# Patient Record
Sex: Female | Born: 1955 | Hispanic: No | Smoking: Never smoker
Health system: Southern US, Community
[De-identification: ages and names within clinical notes are randomized; demographics above are authoritative.]

## PROBLEM LIST (undated history)

## (undated) DIAGNOSIS — N289 Disorder of kidney and ureter, unspecified: Secondary | ICD-10-CM

## (undated) DIAGNOSIS — I10 Essential (primary) hypertension: Secondary | ICD-10-CM

## (undated) HISTORY — PX: LIVER BIOPSY: SHX301

## (undated) HISTORY — PX: CARDIAC CATHETERIZATION: SHX172

---

## 2000-02-12 ENCOUNTER — Other Ambulatory Visit: Admission: RE | Admit: 2000-02-12 | Discharge: 2000-02-12 | Payer: Self-pay | Admitting: Obstetrics and Gynecology

## 2006-03-17 ENCOUNTER — Ambulatory Visit: Payer: Self-pay | Admitting: Cardiology

## 2006-06-08 ENCOUNTER — Emergency Department: Payer: Self-pay | Admitting: Unknown Physician Specialty

## 2007-11-28 ENCOUNTER — Ambulatory Visit: Payer: Self-pay | Admitting: Internal Medicine

## 2008-01-07 ENCOUNTER — Emergency Department: Payer: Self-pay | Admitting: Emergency Medicine

## 2009-03-31 ENCOUNTER — Ambulatory Visit: Payer: Self-pay | Admitting: Internal Medicine

## 2009-11-29 ENCOUNTER — Ambulatory Visit: Payer: Self-pay | Admitting: Unknown Physician Specialty

## 2010-06-29 ENCOUNTER — Ambulatory Visit: Payer: Self-pay | Admitting: Family Medicine

## 2010-06-29 ENCOUNTER — Inpatient Hospital Stay: Payer: Self-pay | Admitting: Internal Medicine

## 2011-01-21 ENCOUNTER — Ambulatory Visit: Payer: Self-pay | Admitting: Unknown Physician Specialty

## 2011-01-22 LAB — PATHOLOGY REPORT

## 2011-06-05 ENCOUNTER — Emergency Department: Payer: Self-pay | Admitting: *Deleted

## 2011-06-16 ENCOUNTER — Encounter: Payer: Self-pay | Admitting: Physician Assistant

## 2011-06-29 ENCOUNTER — Encounter: Payer: Self-pay | Admitting: Physician Assistant

## 2011-07-10 ENCOUNTER — Ambulatory Visit: Payer: Self-pay | Admitting: Internal Medicine

## 2011-07-27 ENCOUNTER — Ambulatory Visit: Payer: Self-pay | Admitting: Physician Assistant

## 2011-07-30 ENCOUNTER — Encounter: Payer: Self-pay | Admitting: Physician Assistant

## 2011-08-29 ENCOUNTER — Encounter: Payer: Self-pay | Admitting: Physician Assistant

## 2011-09-29 ENCOUNTER — Encounter: Payer: Self-pay | Admitting: Physician Assistant

## 2012-02-28 ENCOUNTER — Ambulatory Visit: Payer: Self-pay | Admitting: Medical

## 2012-04-25 ENCOUNTER — Ambulatory Visit: Payer: Self-pay | Admitting: Family Medicine

## 2015-02-19 ENCOUNTER — Ambulatory Visit: Payer: Self-pay

## 2015-02-19 ENCOUNTER — Other Ambulatory Visit: Payer: Self-pay | Admitting: Family Medicine

## 2015-02-19 ENCOUNTER — Ambulatory Visit
Admission: RE | Admit: 2015-02-19 | Discharge: 2015-02-19 | Disposition: A | Discharge status: Home / Self Care | Payer: Managed Care, Other (non HMO) | Source: Ambulatory Visit | Attending: Family Medicine | Admitting: Family Medicine

## 2015-02-19 DIAGNOSIS — R102 Pelvic and perineal pain: Secondary | ICD-10-CM

## 2015-02-19 DIAGNOSIS — G8929 Other chronic pain: Secondary | ICD-10-CM

## 2015-02-19 DIAGNOSIS — D259 Leiomyoma of uterus, unspecified: Secondary | ICD-10-CM | POA: Diagnosis not present

## 2015-03-02 ENCOUNTER — Emergency Department: Payer: Managed Care, Other (non HMO)

## 2015-03-02 ENCOUNTER — Encounter: Payer: Self-pay | Admitting: Emergency Medicine

## 2015-03-02 ENCOUNTER — Emergency Department
Admission: EM | Admit: 2015-03-02 | Discharge: 2015-03-02 | Disposition: A | Discharge status: Home / Self Care | Payer: Managed Care, Other (non HMO) | Attending: Emergency Medicine | Admitting: Emergency Medicine

## 2015-03-02 ENCOUNTER — Other Ambulatory Visit: Payer: Self-pay

## 2015-03-02 DIAGNOSIS — R61 Generalized hyperhidrosis: Secondary | ICD-10-CM | POA: Insufficient documentation

## 2015-03-02 DIAGNOSIS — R0602 Shortness of breath: Secondary | ICD-10-CM | POA: Diagnosis not present

## 2015-03-02 DIAGNOSIS — Z79899 Other long term (current) drug therapy: Secondary | ICD-10-CM | POA: Insufficient documentation

## 2015-03-02 DIAGNOSIS — I10 Essential (primary) hypertension: Secondary | ICD-10-CM | POA: Diagnosis not present

## 2015-03-02 DIAGNOSIS — R079 Chest pain, unspecified: Secondary | ICD-10-CM | POA: Diagnosis present

## 2015-03-02 HISTORY — DX: Disorder of kidney and ureter, unspecified: N28.9

## 2015-03-02 HISTORY — DX: Essential (primary) hypertension: I10

## 2015-03-02 LAB — BASIC METABOLIC PANEL
Anion gap: 9 (ref 5–15)
BUN: 38 mg/dL — ABNORMAL HIGH (ref 6–20)
CALCIUM: 9.2 mg/dL (ref 8.9–10.3)
CO2: 25 mmol/L (ref 22–32)
Chloride: 104 mmol/L (ref 101–111)
Creatinine, Ser: 1.71 mg/dL — ABNORMAL HIGH (ref 0.44–1.00)
GFR calc Af Amer: 37 mL/min — ABNORMAL LOW (ref >60)
GFR calc non Af Amer: 32 mL/min — ABNORMAL LOW (ref >60)
Glucose, Bld: 125 mg/dL — ABNORMAL HIGH (ref 65–99)
Potassium: 4.1 mmol/L (ref 3.5–5.1)
Sodium: 138 mmol/L (ref 135–145)

## 2015-03-02 LAB — CBC
HCT: 36.1 % (ref 35.0–47.0)
HEMOGLOBIN: 12.1 g/dL (ref 12.0–16.0)
MCH: 28.1 pg (ref 26.0–34.0)
MCHC: 33.4 g/dL (ref 32.0–36.0)
MCV: 84.2 fL (ref 80.0–100.0)
Platelets: 247 10*3/uL (ref 150–440)
RBC: 4.29 MIL/uL (ref 3.80–5.20)
RDW: 14 % (ref 11.5–14.5)
WBC: 8.6 10*3/uL (ref 3.6–11.0)

## 2015-03-02 LAB — TROPONIN I
Troponin I: <0.03 ng/mL (ref <0.031)
Troponin I: <0.03 ng/mL (ref <0.031)

## 2015-03-02 NOTE — ED Notes (Signed)
Patient c/o left sided chest pain. Started 15 minutes ago. Radiates through to her left shoulder. Also experienced diaphoresis, nausea and shortness of breath. Denies any hx of cardiac events.

## 2015-03-02 NOTE — Discharge Instructions (Signed)
Please seek medical attention for any high fevers, chest pain, shortness of breath, change in behavior, persistent vomiting, bloody stool or any other new or concerning symptoms. ° °Chest Pain (Nonspecific) °It is often hard to give a specific diagnosis for the cause of chest pain. There is always a chance that your pain could be related to something serious, such as a heart attack or a blood clot in the lungs. You need to follow up with your health care provider for further evaluation. °CAUSES  °· Heartburn. °· Pneumonia or bronchitis. °· Anxiety or stress. °· Inflammation around your heart (pericarditis) or lung (pleuritis or pleurisy). °· A blood clot in the lung. °· A collapsed lung (pneumothorax). It can develop suddenly on its own (spontaneous pneumothorax) or from trauma to the chest. °· Shingles infection (herpes zoster virus). °The chest wall is composed of bones, muscles, and cartilage. Any of these can be the source of the pain. °· The bones can be bruised by injury. °· The muscles or cartilage can be strained by coughing or overwork. °· The cartilage can be affected by inflammation and become sore (costochondritis). °DIAGNOSIS  °Lab tests or other studies may be needed to find the cause of your pain. Your health care provider may have you take a test called an ambulatory electrocardiogram (ECG). An ECG records your heartbeat patterns over a 24-hour period. You may also have other tests, such as: °· Transthoracic echocardiogram (TTE). During echocardiography, sound waves are used to evaluate how blood flows through your heart. °· Transesophageal echocardiogram (TEE). °· Cardiac monitoring. This allows your health care provider to monitor your heart rate and rhythm in real time. °· Holter monitor. This is a portable device that records your heartbeat and can help diagnose heart arrhythmias. It allows your health care provider to track your heart activity for several days, if needed. °· Stress tests by  exercise or by giving medicine that makes the heart beat faster. °TREATMENT  °· Treatment depends on what may be causing your chest pain. Treatment may include: °¨ Acid blockers for heartburn. °¨ Anti-inflammatory medicine. °¨ Pain medicine for inflammatory conditions. °¨ Antibiotics if an infection is present. °· You may be advised to change lifestyle habits. This includes stopping smoking and avoiding alcohol, caffeine, and chocolate. °· You may be advised to keep your head raised (elevated) when sleeping. This reduces the chance of acid going backward from your stomach into your esophagus. °Most of the time, nonspecific chest pain will improve within 2-3 days with rest and mild pain medicine.  °HOME CARE INSTRUCTIONS  °· If antibiotics were prescribed, take them as directed. Finish them even if you start to feel better. °· For the next few days, avoid physical activities that bring on chest pain. Continue physical activities as directed. °· Do not use any tobacco products, including cigarettes, chewing tobacco, or electronic cigarettes. °· Avoid drinking alcohol. °· Only take medicine as directed by your health care provider. °· Follow your health care provider's suggestions for further testing if your chest pain does not go away. °· Keep any follow-up appointments you made. If you do not go to an appointment, you could develop lasting (chronic) problems with pain. If there is any problem keeping an appointment, call to reschedule. °SEEK MEDICAL CARE IF:  °· Your chest pain does not go away, even after treatment. °· You have a rash with blisters on your chest. °· You have a fever. °SEEK IMMEDIATE MEDICAL CARE IF:  °· You have increased chest   pain or pain that spreads to your arm, neck, jaw, back, or abdomen. °· You have shortness of breath. °· You have an increasing cough, or you cough up blood. °· You have severe back or abdominal pain. °· You feel nauseous or vomit. °· You have severe weakness. °· You  faint. °· You have chills. °This is an emergency. Do not wait to see if the pain will go away. Get medical help at once. Call your local emergency services (911 in [place] Do not drive yourself to the hospital. °MAKE SURE YOU:  °· Understand these instructions. °· Will watch your condition. °· Will get help right away if you are not doing well or get worse. °Document Released: 06/24/2005 Document Revised: 09/19/2013 Document Reviewed: 04/19/2008 °[employer]® Patient Information ©2015 [employer]. This information is not intended to replace advice given to you by your health care provider. Make sure you discuss any questions you have with your health care provider. ° °

## 2015-03-02 NOTE — ED Provider Notes (Signed)
[facility] Emergency Department Provider Note   ____________________________________________  Time seen: 1815  I have reviewed the triage vital signs and the nursing notes.   HISTORY  Chief Complaint Chest Pain   History limited by: Not Limited   HPI Audrey Harris is a 59 y.o. female who presents to the emergency department today because of chest pain. Patient states that she was driving down the road when she started developing chest pressure. It was located retrosternally. There was some radiation to the shoulder.Patient states she did have some shortness of breath and sweating with this. The patient states that she had similar symptoms many years ago but not as severe. Patient denies any recent fevers.     Past Medical History  Diagnosis Date  . Hypertension   . Renal disorder     There are no active problems to display for this patient.   Past Surgical History  Procedure Laterality Date  . Cardiac catheterization    . Liver biopsy      Current Outpatient Rx  Name  Route  Sig  Dispense  Refill  . losartan (COZAAR) 50 MG tablet   Oral   Take 50 mg by mouth daily.         . vitamin B-12 (CYANOCOBALAMIN) 1000 MCG tablet   Oral   Take 1,000 mcg by mouth daily.         . Vitamin D, Ergocalciferol, (DRISDOL) 50000 UNITS CAPS capsule   Oral   Take 50,000 Units by mouth every 7 (seven) days.           Allergies Morphine and related and Sulfa antibiotics  History reviewed. No pertinent family history.  Social History History  Substance Use Topics  . Smoking status: Never Smoker   . Smokeless tobacco: Not on file  . Alcohol Use: No    Review of Systems  Constitutional: Negative for fever. Cardiovascular: positive for chest pain. Respiratory: positive for shortness of breath. Gastrointestinal: positive for nausea Genitourinary: Negative for dysuria. Musculoskeletal: Negative for back pain. Skin: Negative for  rash. Neurological: Negative for headaches, focal weakness or numbness.   10-point ROS otherwise negative.  ____________________________________________   PHYSICAL EXAM:  VITAL SIGNS: ED Triage Vitals  Enc Vitals Group     BP 03/02/15 1419 154/71 mmHg     Pulse Rate 03/02/15 1419 94     Resp --      Temp 03/02/15 1419 98.1 F (36.7 C)     Temp Source 03/02/15 1419 Oral     SpO2 03/02/15 1419 99 %     Weight 03/02/15 1419 252 lb (114.306 kg)     Height 03/02/15 1419 5\' 8"  (1.727 m)     Head Cir --      Peak Flow --      Pain Score 03/02/15 1411 8   Constitutional: Alert and oriented. Well appearing and in no distress. Eyes: Conjunctivae are normal. PERRL. Normal extraocular movements. ENT   Head: Normocephalic and atraumatic.   Nose: No congestion/rhinnorhea.   Mouth/Throat: Mucous membranes are moist.   Neck: No stridor. Hematological/Lymphatic/Immunilogical: No cervical lymphadenopathy. Cardiovascular: Normal rate, regular rhythm.  No murmurs, rubs, or gallops. Respiratory: Normal respiratory effort without tachypnea nor retractions. Breath sounds are clear and equal bilaterally. No wheezes/rales/rhonchi. Gastrointestinal: Soft and nontender. No distention.  Genitourinary: Deferred Musculoskeletal: Normal range of motion in all extremities. No joint effusions.  No lower extremity tenderness nor edema. Neurologic:  Normal speech and language. No gross focal  neurologic deficits are appreciated. Speech is normal.  Skin:  Skin is warm, dry and intact. No rash noted. Psychiatric: Mood and affect are normal. Speech and behavior are normal. Patient exhibits appropriate insight and judgment.  ____________________________________________    LABS (pertinent positives/negatives)  Labs Reviewed  BASIC METABOLIC PANEL - Abnormal; Notable for the following:    Glucose, Bld 125 (*)    BUN 38 (*)    Creatinine, Ser 1.71 (*)    GFR calc non Af Amer 32 (*)    GFR  calc Af Amer 37 (*)    All other components within normal limits  TROPONIN I  CBC  TROPONIN I     ____________________________________________   EKG  I, Nance Pear, attending physician, personally viewed and interpreted this EKG  EKG Time: 1414 Rate: 95 Rhythm: Sinus rhythm Axis: Left axis deviation Intervals: QTc 459 QRS: Left anterior fascicular block, left ventricular hypertrophy, q waves V1, V2 ST changes: No st elevation    ____________________________________________    RADIOLOGY  Chest x-ray IMPRESSION: No active cardiopulmonary disease.  CT head  IMPRESSION: 1. No acute intracranial pathology seen on CT. 2. Mild small vessel ischemic microangiopathy noted. ____________________________________________   PROCEDURES  Procedure(s) performed: None  Critical Care performed: No  ____________________________________________   INITIAL IMPRESSION / ASSESSMENT AND PLAN / ED COURSE  Pertinent labs & imaging results that were available during my care of the patient were reviewed by me and considered in my medical decision making (see chart for details).  Patient presented to the emergency department for concerns of chest pain. 2 sets of troponin were negative.prior to being discharged however patient did develop some acute onset headache. Described as left and located behind her eye. I did order a CT head which was negative. Will discharge patient home. Encouraged follow-up with her cardiologist Dr. Ubaldo Glassing  ____________________________________________   FINAL CLINICAL IMPRESSION(S) / ED DIAGNOSES  Final diagnoses:  Chest pain, unspecified chest pain type     Nance Pear, MD 03/02/15 2247

## 2016-05-25 IMAGING — CR DG CHEST 2V
1 series · 2 of 2 positions shown · non-contrast
Comparison: 02/28/2012

CLINICAL DATA: Anterior chest pain and left arm pain today.

EXAM:
CHEST  2 VIEW

[Series 1: dg chest 2 view · 0.14mm/px · 2 of 2 slices shown]
[im 1/2]
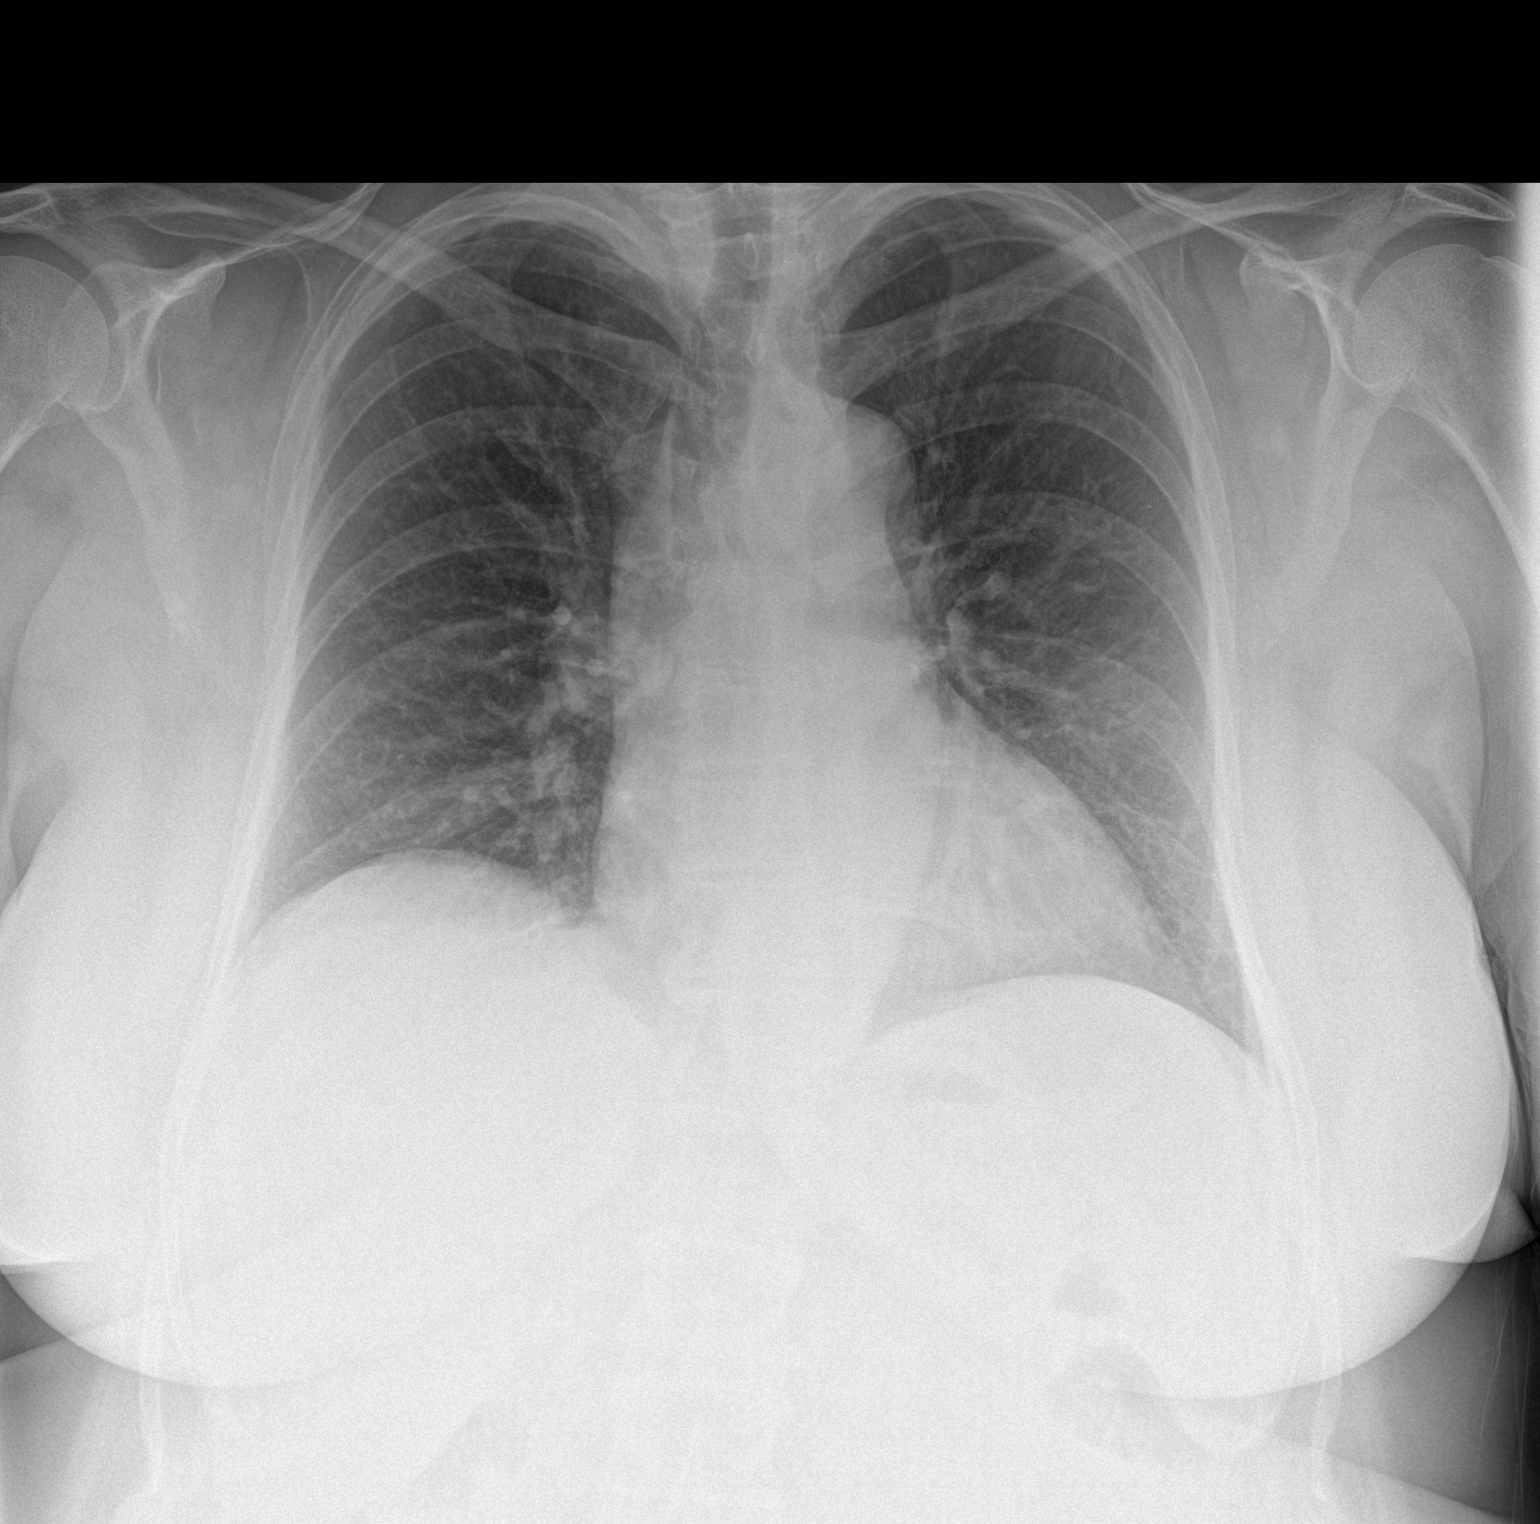
[im 2/2]
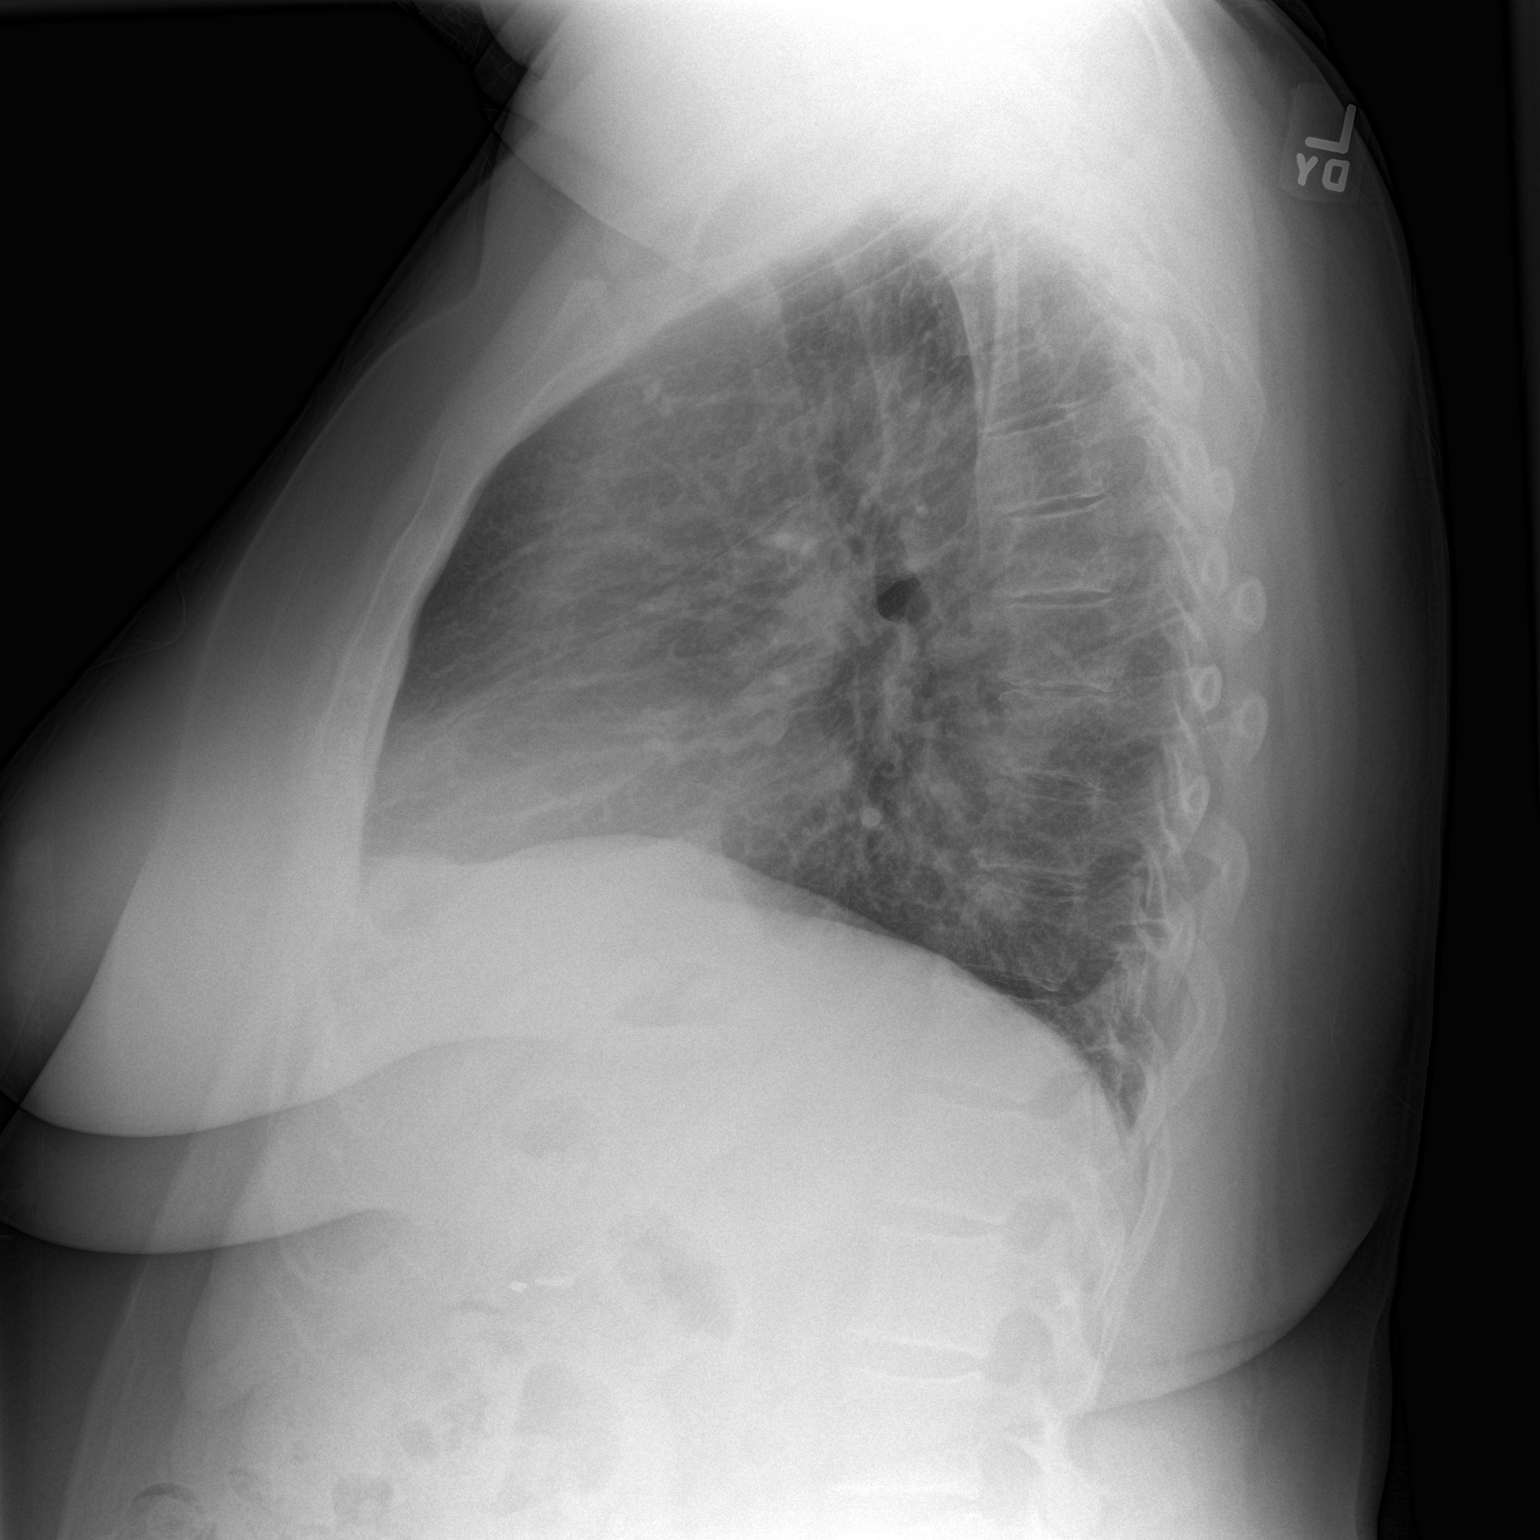

[2 of 2 positions shown; findings below may reference images not displayed]

FINDINGS: Heart size and pulmonary vascularity are normal and the lungs are
clear. Tortuosity of the thoracic aorta. No acute osseous
abnormality. No effusions.
IMPRESSION: No active cardiopulmonary disease.

## 2016-05-25 IMAGING — CT CT HEAD W/O CM
1 series · 15 of 30 positions shown, 19 images · non-contrast
Comparison: None.

CLINICAL DATA: Acute onset of left-sided headache and nausea.
Vomiting. Initial encounter.

EXAM:
CT HEAD WITHOUT CONTRAST
TECHNIQUE: Contiguous axial images were obtained from the base of the skull
through the vertex without intravenous contrast.

[Series 2: head wo · axial · 0.40mm/px · z∈[-136,-10]mm · 15 of 32 slices shown, 19 images]
[im 2/32  brain]
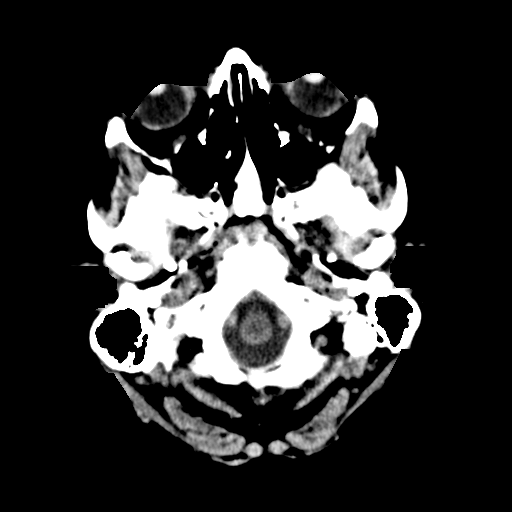
[im 2/32  bone]
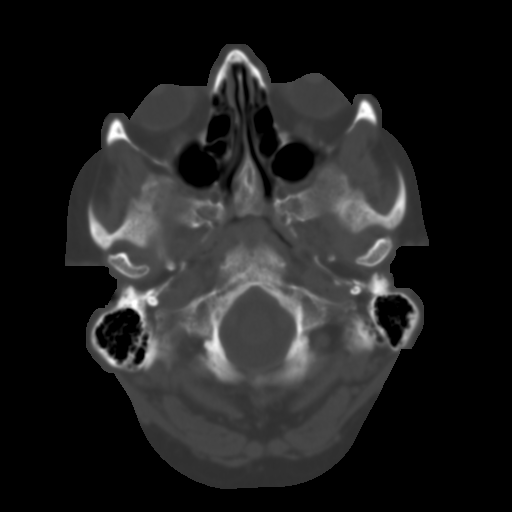
[im 4/32  brain]
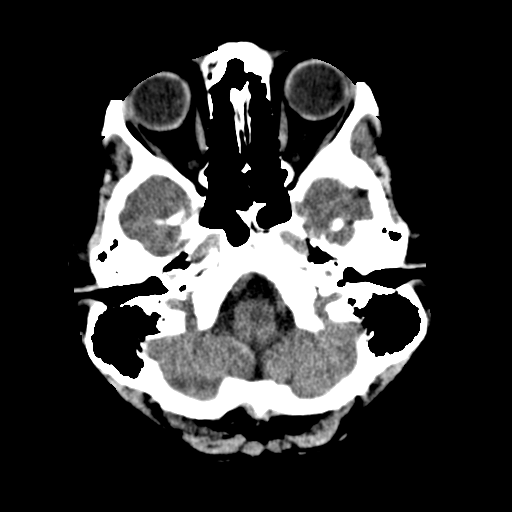
[im 6/32  brain]
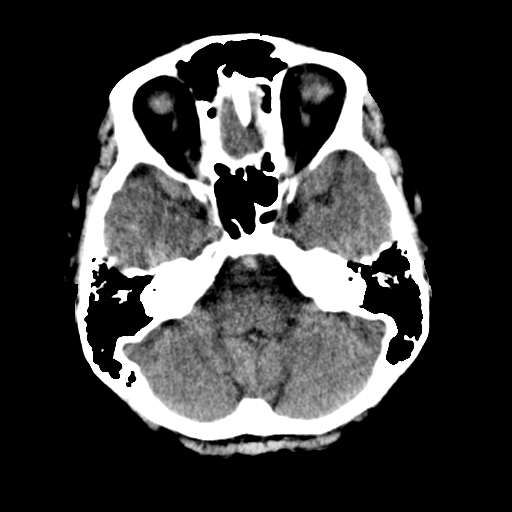
[im 8/32  brain]
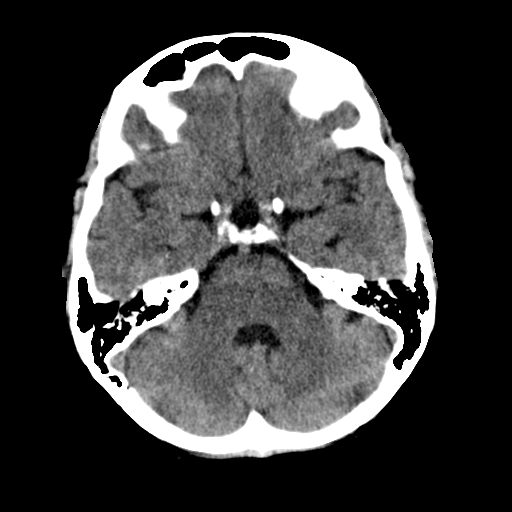
[im 10/32  brain]
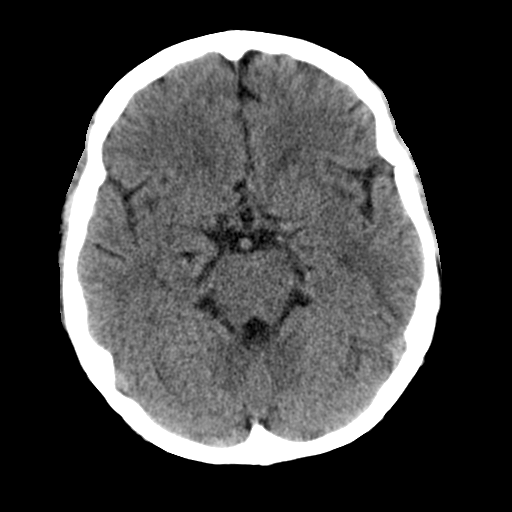
[im 10/32  bone]
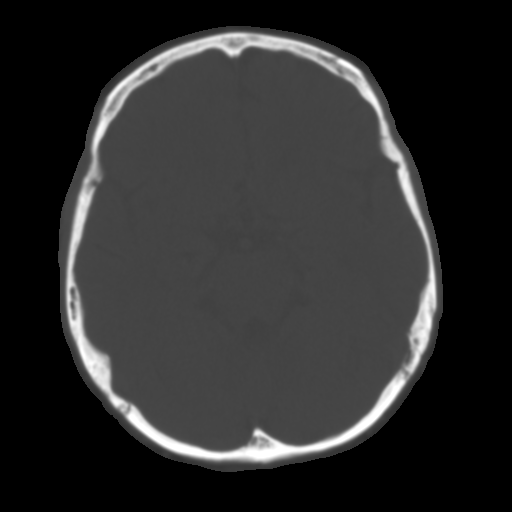
[im 12/32  brain]
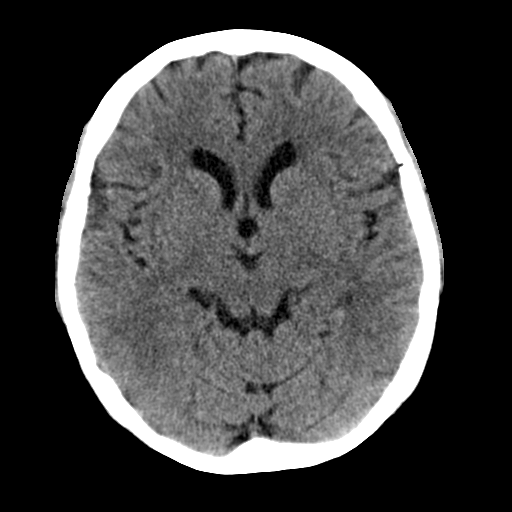
[im 14/32  brain]
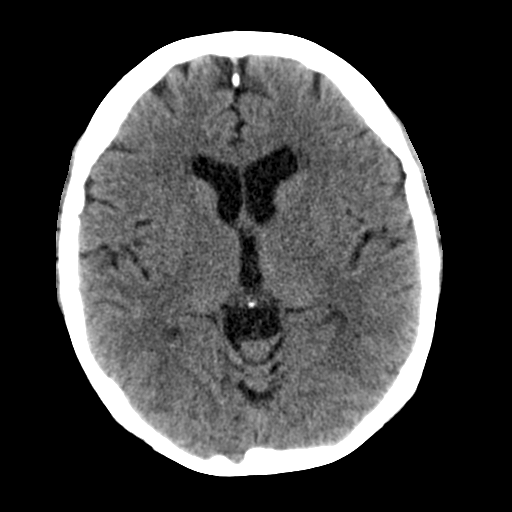
[im 17/32  brain]
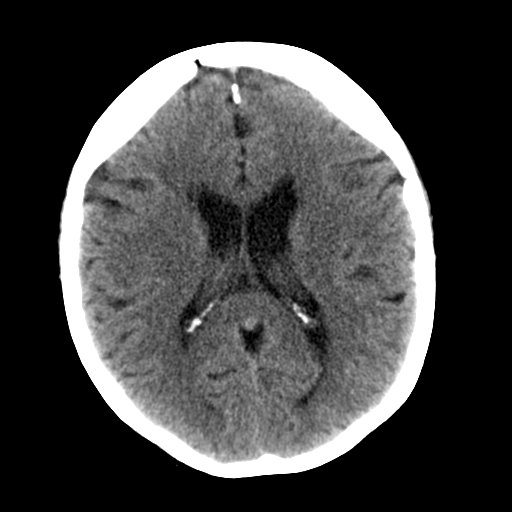
[im 18/32  brain]
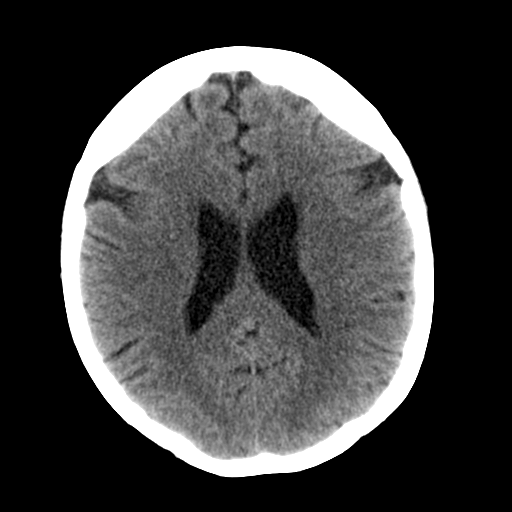
[im 18/32  bone]
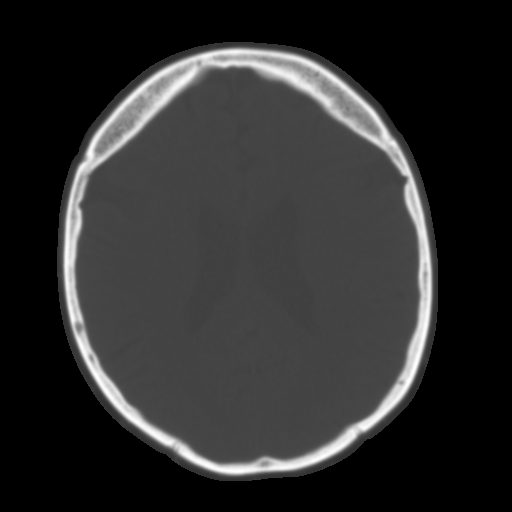
[im 20/32  brain]
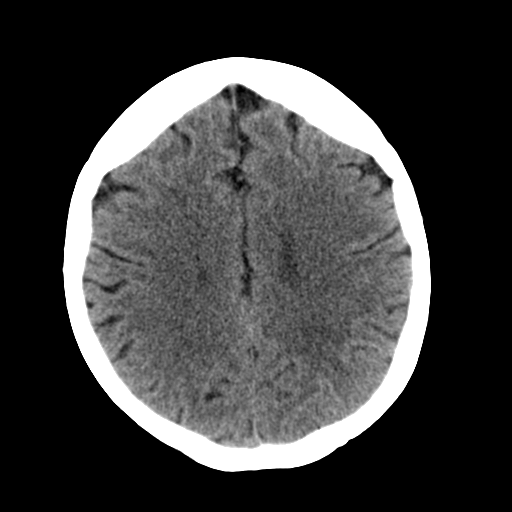
[im 22/32  brain]
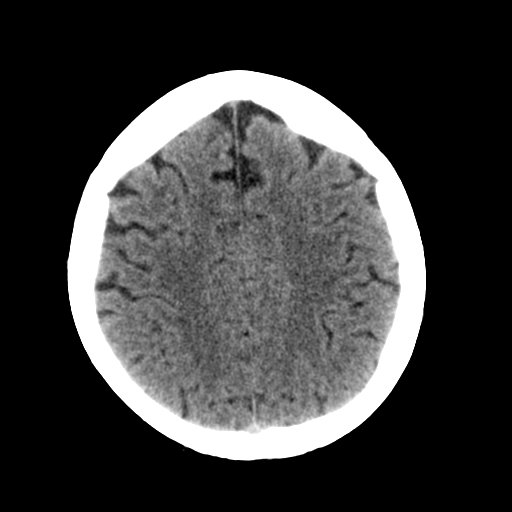
[im 24/32  brain]
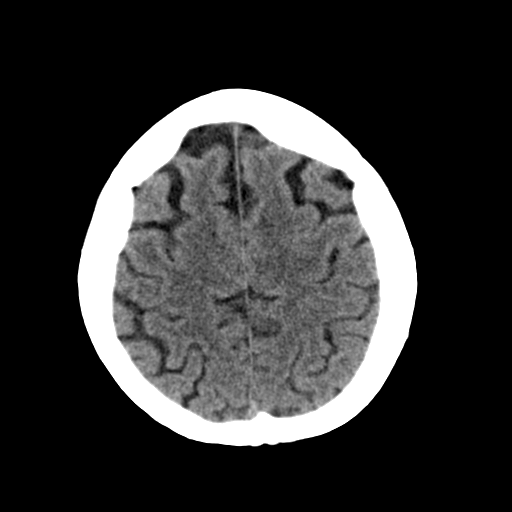
[im 26/32  brain]
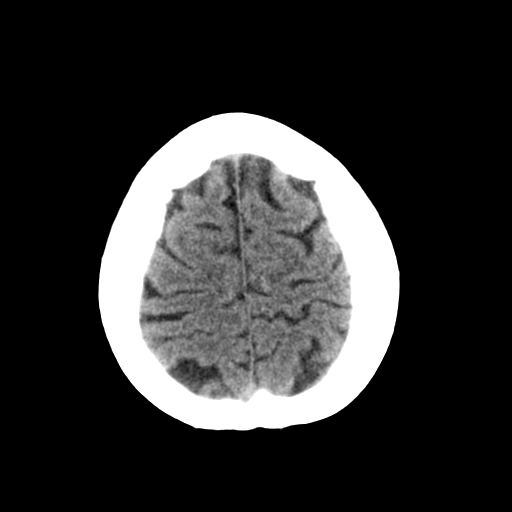
[im 26/32  bone]
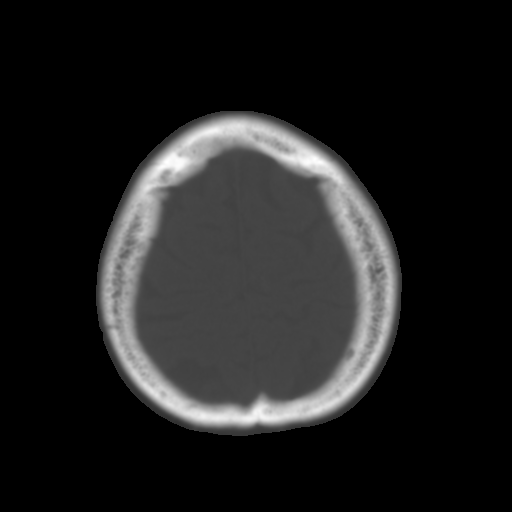
[im 28/32  brain]
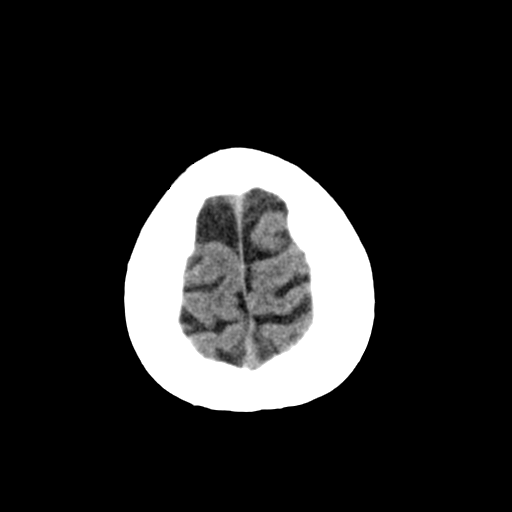
[im 30/32  brain]
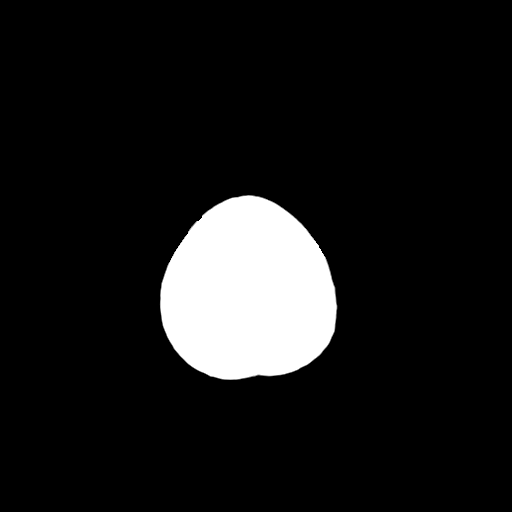

[15 of 30 positions shown; findings below may reference images not displayed]

FINDINGS: There is no evidence of acute infarction, mass lesion, or intra- or
extra-axial hemorrhage on CT.

Mild periventricular white matter change likely reflects small
vessel ischemic microangiopathy.

The posterior fossa, including the cerebellum, brainstem and fourth
ventricle, is within normal limits. The third and lateral
ventricles, and basal ganglia are unremarkable in appearance. The
cerebral hemispheres are symmetric in appearance, with normal
gray-white differentiation. No mass effect or midline shift is seen.

There is no evidence of fracture; visualized osseous structures are
unremarkable in appearance. The visualized portions of the orbits
are within normal limits. The paranasal sinuses and mastoid air
cells are well-aerated. No significant soft tissue abnormalities are
seen.
IMPRESSION: 1. No acute intracranial pathology seen on CT.
2. Mild small vessel ischemic microangiopathy noted.

## 2017-03-05 ENCOUNTER — Ambulatory Visit
Admission: EM | Admit: 2017-03-05 | Discharge: 2017-03-05 | Disposition: A | Discharge status: Home / Self Care | Payer: BLUE CROSS/BLUE SHIELD | Attending: Family Medicine | Admitting: Family Medicine

## 2017-03-05 DIAGNOSIS — M545 Low back pain: Secondary | ICD-10-CM

## 2017-03-05 DIAGNOSIS — R109 Unspecified abdominal pain: Secondary | ICD-10-CM | POA: Diagnosis not present

## 2017-03-05 LAB — URINALYSIS, COMPLETE (UACMP) WITH MICROSCOPIC
Bilirubin Urine: NEGATIVE
Glucose, UA: NEGATIVE mg/dL
Hgb urine dipstick: NEGATIVE
Ketones, ur: NEGATIVE mg/dL
Leukocytes, UA: NEGATIVE
Nitrite: NEGATIVE
Protein, ur: NEGATIVE mg/dL
RBC / HPF: NONE SEEN RBC/hpf (ref 0–5)
Specific Gravity, Urine: 1.02 (ref 1.005–1.030)
pH: 5 (ref 5.0–8.0)

## 2017-03-05 MED ORDER — HYDROCODONE-ACETAMINOPHEN 5-325 MG PO TABS: 1.0000 | ORAL_TABLET | Freq: Four times a day (QID) | ORAL | 0 refills | Status: AC | PRN

## 2017-03-05 MED ORDER — KETOROLAC TROMETHAMINE 60 MG/2ML IM SOLN
60.0000 mg | Freq: Once | INTRAMUSCULAR | Status: AC
  Administered 2017-03-05: 30 mg via INTRAMUSCULAR

## 2017-03-05 NOTE — ED Provider Notes (Signed)
CSN: [id number]     Arrival date & time 03/05/17  1823 History   First MD Initiated Contact with Patient 03/05/17 1940     Chief Complaint  Patient presents with  . Back Pain   (Consider location/radiation/quality/duration/timing/severity/associated sxs/prior Treatment) HPI  61 year old female who was awakened at 2 AM with right-sided low back pain that has radiated to her right flank and to the lower abdomen. She has had no nausea or vomiting. His had no recent injury that she can remember. Has a history of kidney stones in the past and she is concerned that this is exactly what she had before. Seen in her primary care office today they obtained a urine did not show any blood in sent her home on ketorolac. He does have stage III kidney disease and must be careful with nonsteroidal anti-inflammatories. She has a kidney doctor at [facility].      Past Medical History:  Diagnosis Date  . Hypertension   . Renal disorder    Past Surgical History:  Procedure Laterality Date  . CARDIAC CATHETERIZATION    . LIVER BIOPSY     History reviewed. No pertinent family history. Social History  Substance Use Topics  . Smoking status: Never Smoker  . Smokeless tobacco: Never Used  . Alcohol use No   OB History    No data available     Review of Systems  Constitutional: Positive for activity change. Negative for chills, fatigue and fever.  Genitourinary: Positive for flank pain.  Musculoskeletal: Positive for back pain.  All other systems reviewed and are negative.   Allergies  Morphine and related and Sulfa antibiotics  Home Medications   Prior to Admission medications   Medication Sig Start Date End Date Taking? Authorizing Provider  HYDROcodone-acetaminophen (NORCO/VICODIN) 5-325 MG tablet Take 1-2 tablets by mouth every 6 (six) hours as needed for severe pain. 03/05/17   Lorin Picket, PA-C  losartan (COZAAR) 50 MG tablet Take 50 mg by mouth daily.    [provider]  vitamin B-12 (CYANOCOBALAMIN) 1000 MCG tablet Take 1,000 mcg by mouth daily.    [provider]  Vitamin D, Ergocalciferol, (DRISDOL) 50000 UNITS CAPS capsule Take 50,000 Units by mouth every 7 (seven) days.    [provider]   Meds Ordered and Administered this Visit   Medications  ketorolac (TORADOL) injection 60 mg (30 mg Intramuscular Given 03/05/17 1957)    BP (!) 142/70 (BP Location: Left Arm)   Pulse (!) 103   Temp 98.3 F (36.8 C) (Oral)   Resp 18   Ht 5' 8.5" (1.74 m)   Wt 270 lb (122.5 kg)   SpO2 98%   BMI 40.46 kg/m  No data found.   Physical Exam  Constitutional: She is oriented to person, place, and time. She appears well-developed and well-nourished. No distress.  HENT:  Head: Normocephalic.  Eyes: Pupils are equal, round, and reactive to light.  Neck: Normal range of motion.  Musculoskeletal: Normal range of motion. She exhibits tenderness. She exhibits no edema.  The patient has  point tenderness in the paraspinous muscles on the right at the L1 level. Range of motion is full without discomfort. Forward flex with her hands level of the ankles. A returning to upright posture is not difficult bilateral lateral flexion is not uncomfortable. Thoracic rotation does not cause any discomfort.  Neurological: She is alert and oriented to person, place, and time.  Skin: Skin is warm and dry. She  is not diaphoretic.  Psychiatric: She has a normal mood and affect. Her behavior is normal. Judgment and thought content normal.  Nursing note and vitals reviewed.   Urgent Care Course     Procedures (including critical care time)  Labs Review Labs Reviewed  URINALYSIS, COMPLETE (UACMP) WITH MICROSCOPIC - Abnormal; Notable for the following:       Result Value   Squamous Epithelial / LPF 0-5 (*)    Bacteria, UA FEW (*)    All other components within normal limits    Imaging Review No results found.   Visual Acuity Review  Right  Eye Distance:   Left Eye Distance:   Bilateral Distance:    Right Eye Near:   Left Eye Near:    Bilateral Near:     The patient's profile was checked on [place] substance abuse registry. There are no red flags. Prescriptions were written by a primary care physician    MDM   1. Acute right flank pain    New Prescriptions   HYDROCODONE-ACETAMINOPHEN (NORCO/VICODIN) 5-325 MG TABLET    Take 1-2 tablets by mouth every 6 (six) hours as needed for severe pain.  Plan: 1. Test/x-ray results and diagnosis reviewed with patient 2. rx as per orders; risks, benefits, potential side effects reviewed with patient 3. Recommend supportive treatment with urine strainers to check for possible kidney stones that passed. I've asked her to increase her fluid intake. I will provide her with narcotic medications for the pain that she is experiencing. I told her I'm not entirely certain that it's a kidney stone and could be a muscle strain since she does have point tenderness in the paraspinous muscles at that level. If it worsens she may return to our clinic for CT scan. We decided against it tonight provider with pain medications and see if she does pass it spontaneously if a stone is present. If she does well over the weekend she should follow-up again with her primary care physician. 4. F/u prn if symptoms worsen or don't improve     Lorin Picket, PA-C 03/05/17 2038

## 2017-03-05 NOTE — ED Triage Notes (Signed)
Patient complains of right lower back pain that radiates to flank and around to lower abdomen.

## 2017-03-08 ENCOUNTER — Other Ambulatory Visit: Payer: Self-pay | Admitting: Family Medicine

## 2017-03-08 ENCOUNTER — Ambulatory Visit
Admission: RE | Admit: 2017-03-08 | Discharge: 2017-03-08 | Disposition: A | Discharge status: Home / Self Care | Payer: BLUE CROSS/BLUE SHIELD | Source: Ambulatory Visit | Attending: Family Medicine | Admitting: Family Medicine

## 2017-03-08 DIAGNOSIS — K5730 Diverticulosis of large intestine without perforation or abscess without bleeding: Secondary | ICD-10-CM | POA: Diagnosis not present

## 2017-03-08 DIAGNOSIS — R109 Unspecified abdominal pain: Secondary | ICD-10-CM | POA: Diagnosis present

## 2018-06-01 IMAGING — CT CT ABD-PELV W/O CM
2 of 4 series · 16 of 46 positions shown, 18 images · non-contrast
Comparison: CT abdomen pelvis 07/10/2011

CLINICAL DATA: Right flank pain and leukocytosis. History kidney
stones.

EXAM:
CT ABDOMEN AND PELVIS WITHOUT CONTRAST
TECHNIQUE: Multidetector CT imaging of the abdomen and pelvis was performed
following the standard protocol without IV contrast.

[Series 2: axial st · axial · 0.94mm/px · z∈[-515,-75]mm · 13 of 98 slices shown, 15 images]
[im 5/98  soft-tissue]
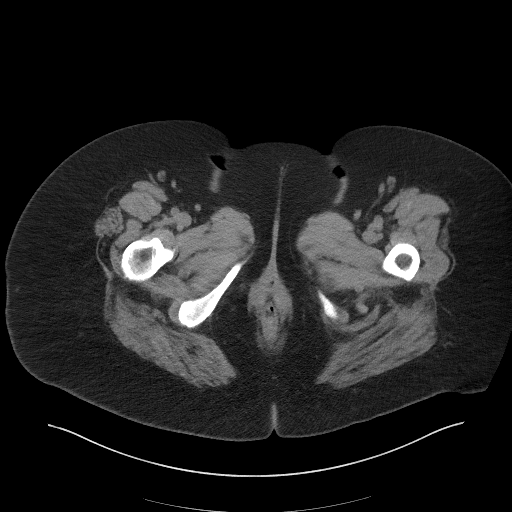
[im 5/98  bone]
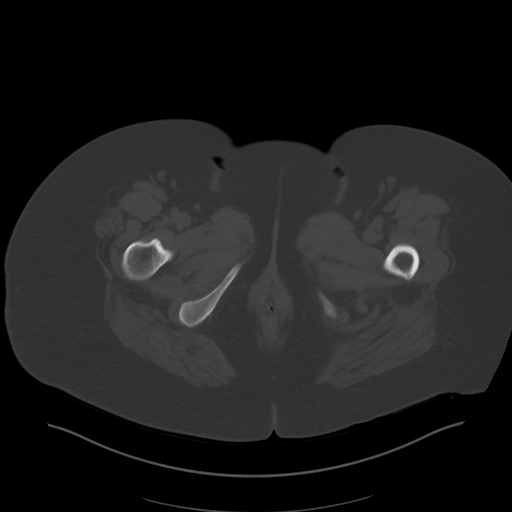
[im 13/98  soft-tissue]
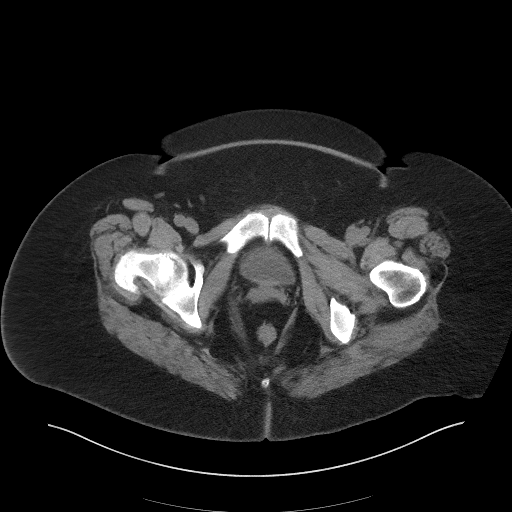
[im 21/98  soft-tissue]
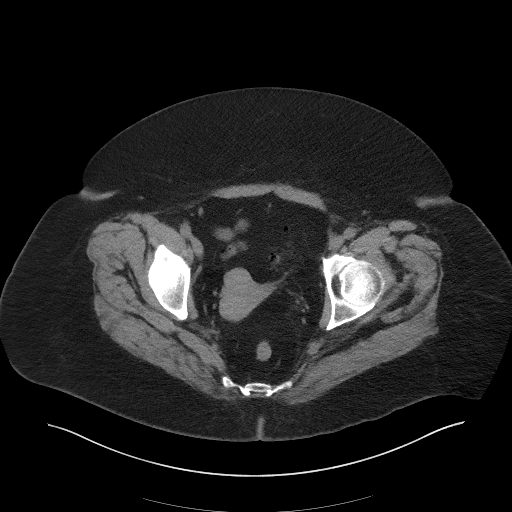
[im 29/98  soft-tissue]
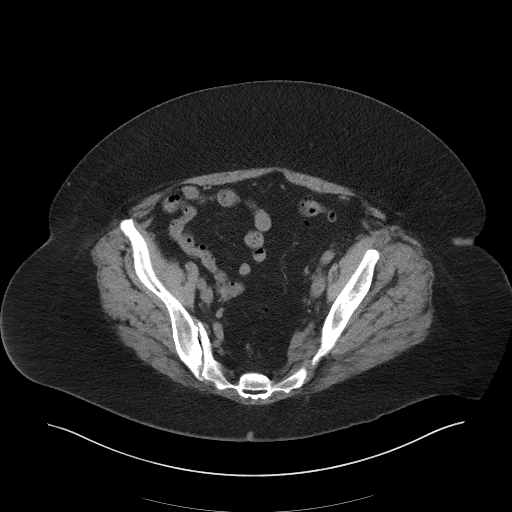
[im 33/98  soft-tissue]
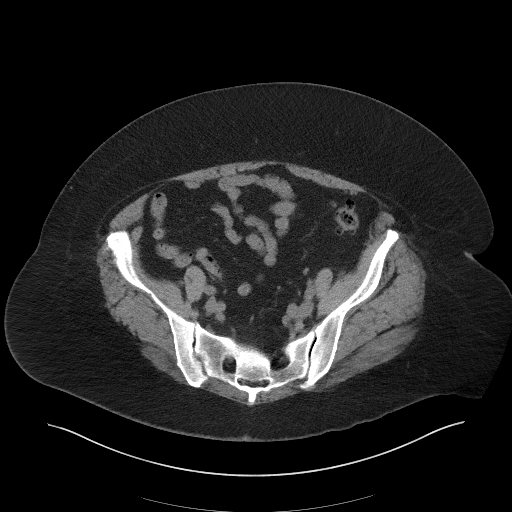
[im 41/98  soft-tissue]
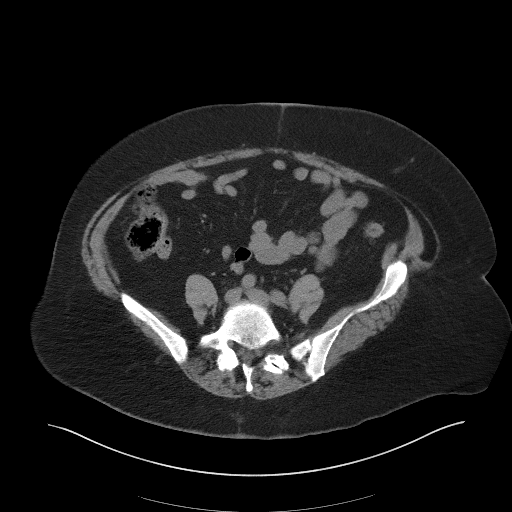
[im 49/98  soft-tissue]
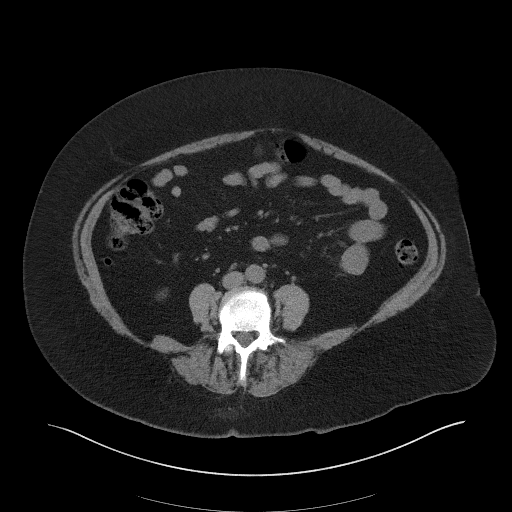
[im 57/98  soft-tissue]
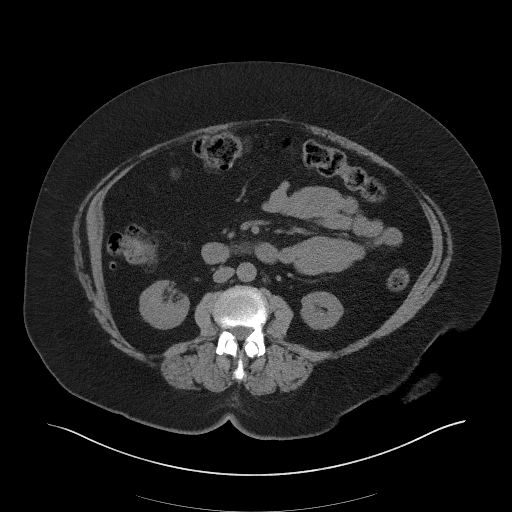
[im 65/98  soft-tissue]
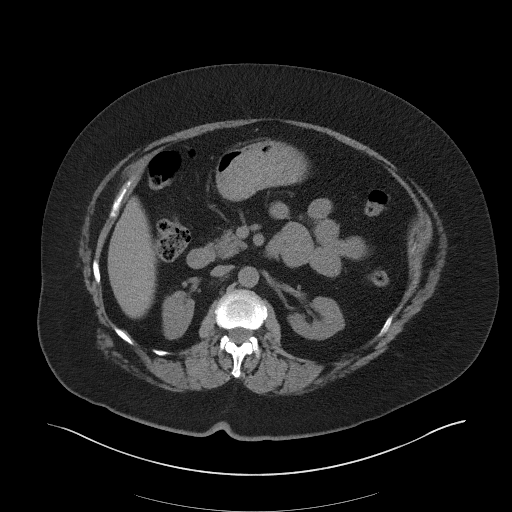
[im 65/98  bone]
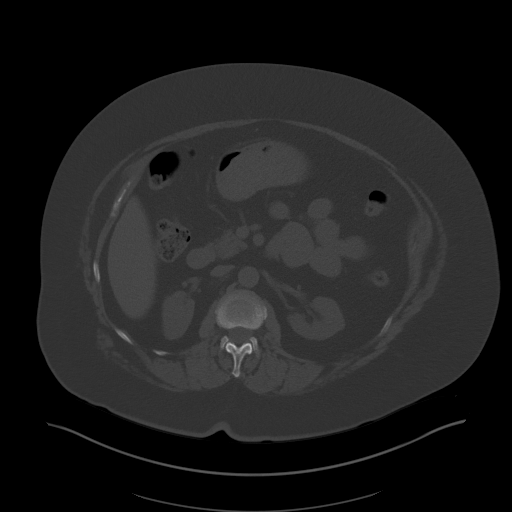
[im 69/98  soft-tissue]
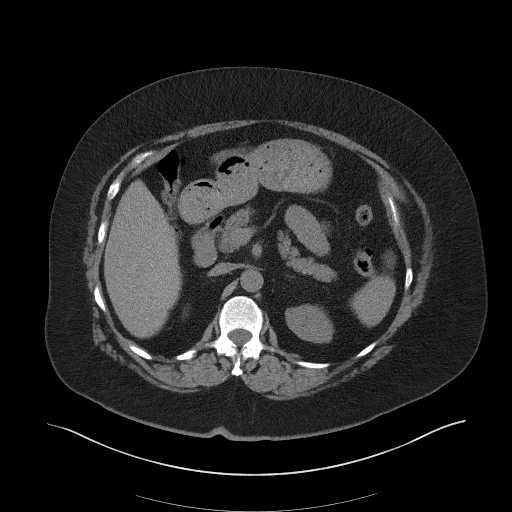
[im 77/98  soft-tissue]
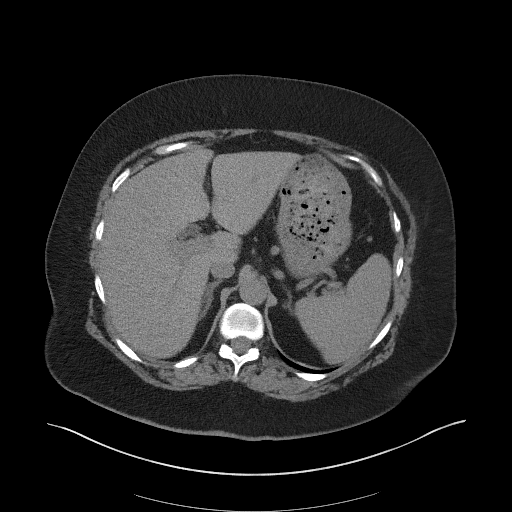
[im 85/98  soft-tissue]
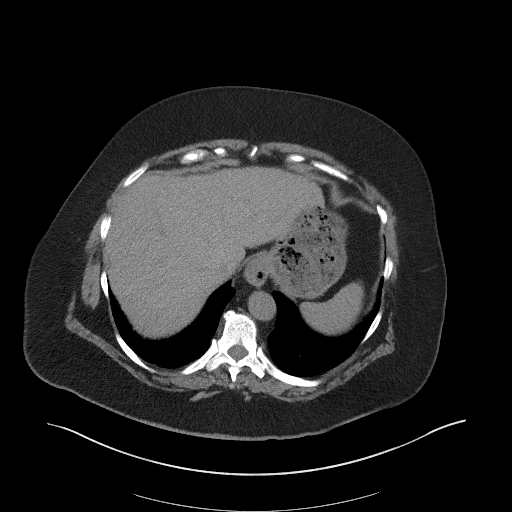
[im 93/98  soft-tissue]
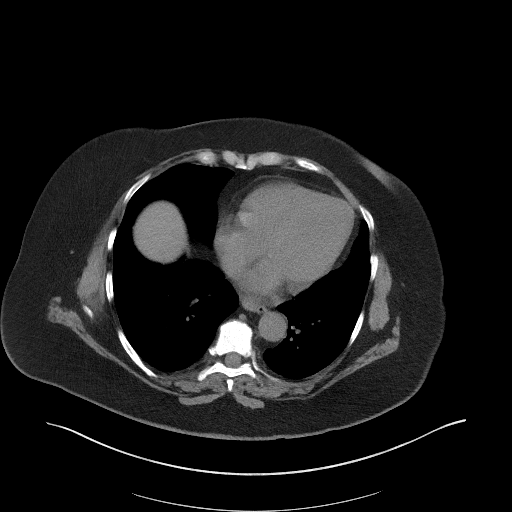

[Series 5: coronal st · coronal · 0.79mm/px · 3 of 114 slices shown]
[im 38/114  soft-tissue]
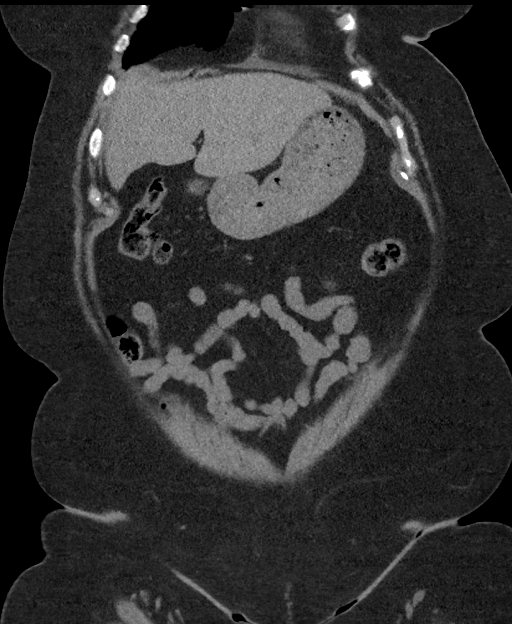
[im 51/114  soft-tissue]
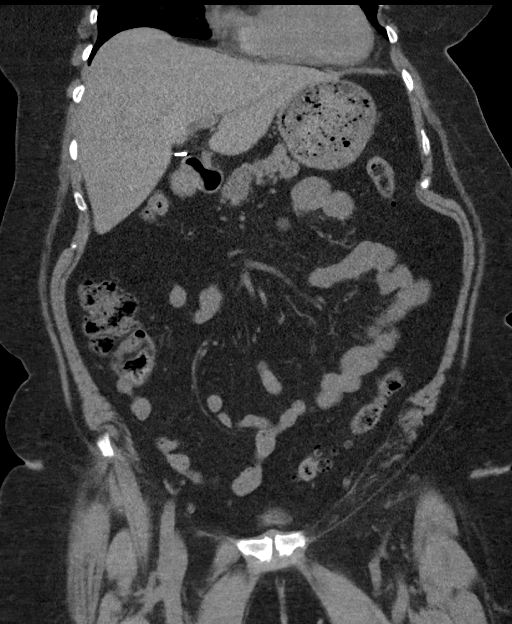
[im 63/114  soft-tissue]
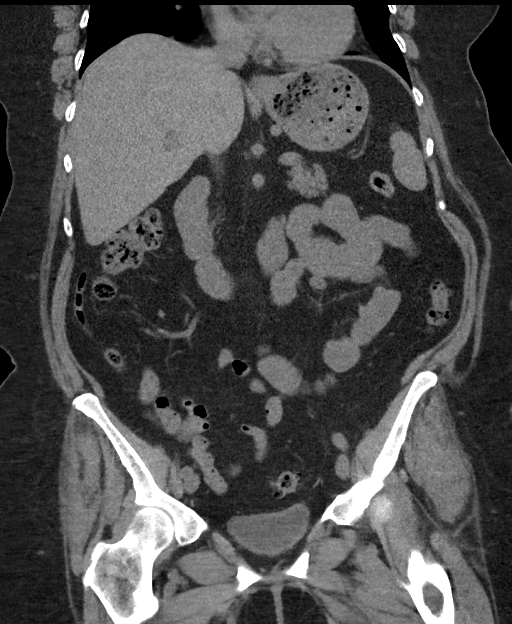

[16 of 46 positions shown; findings below may reference images not displayed]

FINDINGS: Lower chest: No pulmonary nodules or pleural effusion. No visible
pericardial effusion.

Hepatobiliary: Normal noncontrast appearance of the liver. No
visible biliary dilatation. Surgical absence of the gallbladder.

Pancreas: Normal noncontrast appearance of the pancreas. No
peripancreatic fluid collection.

Spleen: Normal.

Adrenals/Urinary Tract:

--Adrenal glands: Normal.

--Right kidney/ureter: No hydronephrosis or perinephric stranding.
No nephrolithiasis. No obstructing ureteral stones.

--Left kidney/ureter: No hydronephrosis or perinephric stranding. No
nephrolithiasis. No obstructing ureteral stones. Left lower pole
renal cyst measures 1.5 cm.

--Urinary bladder: Unremarkable.

Stomach/Bowel: There is no hiatal hernia. The stomach and duodenum
are normal. There is no dilated small bowel or enteric inflammation.
There is rectosigmoid diverticulosis without inflammation. The
appendix is normal.

Vascular/Lymphatic: No abdominal aortic aneurysm or atherosclerotic
calcification. No abdominal or pelvic lymphadenopathy.

Reproductive: Normal uterus.  No adnexal mass.

Musculoskeletal. No focal osseous lesion. Normal visualized
extraperitoneal and extrathoracic soft tissues.
IMPRESSION: 1. No obstructive uropathy or other acute abnormality of the abdomen
or pelvis. No nephrolithiasis.
2. Rectosigmoid diverticulosis without acute inflammation.
